# Patient Record
Sex: Male | Born: 1995 | Race: White | Hispanic: No | Marital: Single | Smoking: Never smoker
Health system: Southern US, Community
[De-identification: ages and names within clinical notes are randomized; demographics above are authoritative.]

## PROBLEM LIST (undated history)

## (undated) DIAGNOSIS — S060X9A Concussion with loss of consciousness of unspecified duration, initial encounter: Secondary | ICD-10-CM

## (undated) DIAGNOSIS — S060XAA Concussion with loss of consciousness status unknown, initial encounter: Secondary | ICD-10-CM

---
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
## 2015-04-22 ENCOUNTER — Encounter (HOSPITAL_COMMUNITY): Payer: Self-pay | Admitting: *Deleted

## 2015-04-22 ENCOUNTER — Emergency Department (HOSPITAL_COMMUNITY)
Admission: EM | Admit: 2015-04-22 | Discharge: 2015-04-22 | Disposition: A | Discharge status: Home / Self Care | Payer: BLUE CROSS/BLUE SHIELD | Attending: Emergency Medicine | Admitting: Emergency Medicine

## 2015-04-22 DIAGNOSIS — Y9389 Activity, other specified: Secondary | ICD-10-CM | POA: Diagnosis not present

## 2015-04-22 DIAGNOSIS — Y998 Other external cause status: Secondary | ICD-10-CM | POA: Diagnosis not present

## 2015-04-22 DIAGNOSIS — Y9289 Other specified places as the place of occurrence of the external cause: Secondary | ICD-10-CM | POA: Insufficient documentation

## 2015-04-22 DIAGNOSIS — W228XXA Striking against or struck by other objects, initial encounter: Secondary | ICD-10-CM | POA: Insufficient documentation

## 2015-04-22 DIAGNOSIS — S060X0A Concussion without loss of consciousness, initial encounter: Secondary | ICD-10-CM | POA: Diagnosis not present

## 2015-04-22 DIAGNOSIS — S0990XA Unspecified injury of head, initial encounter: Secondary | ICD-10-CM | POA: Diagnosis present

## 2015-04-22 HISTORY — DX: Concussion with loss of consciousness status unknown, initial encounter: S06.0XAA

## 2015-04-22 HISTORY — DX: Concussion with loss of consciousness of unspecified duration, initial encounter: S06.0X9A

## 2015-04-22 MED ORDER — IBUPROFEN 600 MG PO TABS: 600.0000 mg | ORAL_TABLET | Freq: Four times a day (QID) | ORAL | Status: AC | PRN

## 2015-04-22 MED ORDER — MECLIZINE HCL 25 MG PO TABS: 25.0000 mg | ORAL_TABLET | Freq: Three times a day (TID) | ORAL | Status: AC | PRN

## 2015-04-22 NOTE — ED Notes (Signed)
Pt states that he hit his head on a countertop a week ago. Pt states that he has had headache and dizziness since then. Pt alert and oriented at this time.

## 2015-04-22 NOTE — Discharge Instructions (Signed)
Ibuprofen for pain. Meclizine for dizziness as needed. Rest. Avoid strenuous activity. Follow up as needed. Return if worsening.    Concussion, Adult A concussion, or closed-head injury, is a brain injury caused by a direct blow to the head or by a quick and sudden movement (jolt) of the head or neck. Concussions are usually not life-threatening. Even so, the effects of a concussion can be serious. If you have had a concussion before, you are more likely to experience concussion-like symptoms after a direct blow to the head.  CAUSES  Direct blow to the head, such as from running into another player during a soccer game, being hit in a fight, or hitting your head on a hard surface.  A jolt of the head or neck that causes the brain to move back and forth inside the skull, such as in a car crash. SIGNS AND SYMPTOMS The signs of a concussion can be hard to notice. Early on, they may be missed by you, family members, and health care providers. You may look fine but act or feel differently. Symptoms are usually temporary, but they may last for days, weeks, or even longer. Some symptoms may appear right away while others may not show up for hours or days. Every head injury is different. Symptoms include:  Mild to moderate headaches that will not go away.  A feeling of pressure inside your head.  Having more trouble than usual:  Learning or remembering things you have heard.  Answering questions.  Paying attention or concentrating.  Organizing daily tasks.  Making decisions and solving problems.  Slowness in thinking, acting or reacting, speaking, or reading.  Getting lost or being easily confused.  Feeling tired all the time or lacking energy (fatigued).  Feeling drowsy.  Sleep disturbances.  Sleeping more than usual.  Sleeping less than usual.  Trouble falling asleep.  Trouble sleeping (insomnia).  Loss of balance or feeling lightheaded or dizzy.  Nausea or  vomiting.  Numbness or tingling.  Increased sensitivity to:  Sounds.  Lights.  Distractions.  Vision problems or eyes that tire easily.  Diminished sense of taste or smell.  Ringing in the ears.  Mood changes such as feeling sad or anxious.  Becoming easily irritated or angry for little or no reason.  Lack of motivation.  Seeing or hearing things other people do not see or hear (hallucinations). DIAGNOSIS Your health care provider can usually diagnose a concussion based on a description of your injury and symptoms. He or she will ask whether you passed out (lost consciousness) and whether you are having trouble remembering events that happened right before and during your injury. Your evaluation might include:  A brain scan to look for signs of injury to the brain. Even if the test shows no injury, you may still have a concussion.  Blood tests to be sure other problems are not present. TREATMENT  Concussions are usually treated in an emergency department, in urgent care, or at a clinic. You may need to stay in the hospital overnight for further treatment.  Tell your health care provider if you are taking any medicines, including prescription medicines, over-the-counter medicines, and natural remedies. Some medicines, such as blood thinners (anticoagulants) and aspirin, may increase the chance of complications. Also tell your health care provider whether you have had alcohol or are taking illegal drugs. This information may affect treatment.  Your health care provider will send you home with important instructions to follow.  How fast you will recover  from a concussion depends on many factors. These factors include how severe your concussion is, what part of your brain was injured, your age, and how healthy you were before the concussion.  Most people with mild injuries recover fully. Recovery can take time. In general, recovery is slower in older persons. Also, persons who  have had a concussion in the past or have other medical problems may find that it takes longer to recover from their current injury. HOME CARE INSTRUCTIONS General Instructions  Carefully follow the directions your health care provider gave you.  Only take over-the-counter or prescription medicines for pain, discomfort, or fever as directed by your health care provider.  Take only those medicines that your health care provider has approved.  Do not drink alcohol until your health care provider says you are well enough to do so. Alcohol and certain other drugs may slow your recovery and can put you at risk of further injury.  If it is harder than usual to remember things, write them down.  If you are easily distracted, try to do one thing at a time. For example, do not try to watch TV while fixing dinner.  Talk with family members or close friends when making important decisions.  Keep all follow-up appointments. Repeated evaluation of your symptoms is recommended for your recovery.  Watch your symptoms and tell others to do the same. Complications sometimes occur after a concussion. Older adults with a brain injury may have a higher risk of serious complications, such as a blood clot on the brain.  Tell your teachers, school nurse, school counselor, coach, athletic trainer, or work Production designer, theatre/television/filmmanager about your injury, symptoms, and restrictions. Tell them about what you can or cannot do. They should watch for:  Increased problems with attention or concentration.  Increased difficulty remembering or learning new information.  Increased time needed to complete tasks or assignments.  Increased irritability or decreased ability to cope with stress.  Increased symptoms.  Rest. Rest helps the brain to heal. Make sure you:  Get plenty of sleep at night. Avoid staying up late at night.  Keep the same bedtime hours on weekends and weekdays.  Rest during the day. Take daytime naps or rest breaks  when you feel tired.  Limit activities that require a lot of thought or concentration. These include:  Doing homework or job-related work.  Watching TV.  Working on the computer.  Avoid any situation where there is potential for another head injury (football, hockey, soccer, basketball, martial arts, downhill snow sports and horseback riding). Your condition will get worse every time you experience a concussion. You should avoid these activities until you are evaluated by the appropriate follow-up health care providers. Returning To Your Regular Activities You will need to return to your normal activities slowly, not all at once. You must give your body and brain enough time for recovery.  Do not return to sports or other athletic activities until your health care provider tells you it is safe to do so.  Ask your health care provider when you can drive, ride a bicycle, or operate heavy machinery. Your ability to react may be slower after a brain injury. Never do these activities if you are dizzy.  Ask your health care provider about when you can return to work or school. Preventing Another Concussion It is very important to avoid another brain injury, especially before you have recovered. In rare cases, another injury can lead to permanent brain damage, brain swelling, or  death. The risk of this is greatest during the first 7-10 days after a head injury. Avoid injuries by:  Wearing a seat belt when riding in a car.  Drinking alcohol only in moderation.  Wearing a helmet when biking, skiing, skateboarding, skating, or doing similar activities.  Avoiding activities that could lead to a second concussion, such as contact or recreational sports, until your health care provider says it is okay.  Taking safety measures in your home.  Remove clutter and tripping hazards from floors and stairways.  Use grab bars in bathrooms and handrails by stairs.  Place non-slip mats on floors and in  bathtubs.  Improve lighting in dim areas. SEEK MEDICAL CARE IF:  You have increased problems paying attention or concentrating.  You have increased difficulty remembering or learning new information.  You need more time to complete tasks or assignments than before.  You have increased irritability or decreased ability to cope with stress.  You have more symptoms than before. Seek medical care if you have any of the following symptoms for more than 2 weeks after your injury:  Lasting (chronic) headaches.  Dizziness or balance problems.  Nausea.  Vision problems.  Increased sensitivity to noise or light.  Depression or mood swings.  Anxiety or irritability.  Memory problems.  Difficulty concentrating or paying attention.  Sleep problems.  Feeling tired all the time. SEEK IMMEDIATE MEDICAL CARE IF:  You have severe or worsening headaches. These may be a sign of a blood clot in the brain.  You have weakness (even if only in one hand, leg, or part of the face).  You have numbness.  You have decreased coordination.  You vomit repeatedly.  You have increased sleepiness.  One pupil is larger than the other.  You have convulsions.  You have slurred speech.  You have increased confusion. This may be a sign of a blood clot in the brain.  You have increased restlessness, agitation, or irritability.  You are unable to recognize people or places.  You have neck pain.  It is difficult to wake you up.  You have unusual behavior changes.  You lose consciousness. MAKE SURE YOU:  Understand these instructions.  Will watch your condition.  Will get help right away if you are not doing well or get worse.   This information is not intended to replace advice given to you by your health care provider. Make sure you discuss any questions you have with your health care provider.   Document Released: 08/27/2003 Document Revised: 06/27/2014 Document Reviewed:  12/27/2012 Elsevier Interactive Patient Education [employer].

## 2015-04-22 NOTE — ED Provider Notes (Signed)
CSN: [id number]     Arrival date & time 04/22/15  1128 History   First MD Initiated Contact with Patient 04/22/15 1154     Chief Complaint  Patient presents with  . Head Injury     (Consider location/radiation/quality/duration/timing/severity/associated sxs/prior Treatment) HPI Riley Frey is a 19 y.o. male medical problems, history of concussion, presents to emergency department complaining of a head injury with intermittent headaches. Patient states that he was at a concert, states hit back of his head on a countertop. He denies loss of consciousness. He states he had some pain at that time. States next morning he developed a headache and ringing in his ears. Patient states that he thought maybe it was related to the loud music. Since then he has had intermittent headache with dizziness, lightheadedness, nausea. He denies any changes in vision. He denies any numbness or weakness in extremities. He denies any neck pain. He denies any vomiting. He also states he has had some nasal congestion and fullness in ears which he attributes to allergies. He states he is able to ambulate normally. He did get dizzy when he tried to skateboard a few days ago. He is not on any blood thinners. No other complaints. Did not take any medications prior to coming in  Past Medical History  Diagnosis Date  . Concussion    History reviewed. No pertinent past surgical history. No family history on file. Social History  Substance Use Topics  . Smoking status: Never Smoker   . Smokeless tobacco: None  . Alcohol Use: None    Review of Systems  Constitutional: Negative for fever and chills.  Eyes: Negative for visual disturbance.  Respiratory: Negative for cough, chest tightness and shortness of breath.   Cardiovascular: Negative for chest pain, palpitations and leg swelling.  Gastrointestinal: Positive for nausea. Negative for vomiting.  Genitourinary: Negative for dysuria, urgency, frequency and  hematuria.  Musculoskeletal: Negative for myalgias, arthralgias, neck pain and neck stiffness.  Skin: Negative for rash.  Allergic/Immunologic: Negative for immunocompromised state.  Neurological: Positive for dizziness, light-headedness and headaches. Negative for weakness and numbness.  All other systems reviewed and are negative.     Allergies  Review of patient's allergies indicates no known allergies.  Home Medications   Prior to Admission medications   Not on File   BP 107/58 mmHg  Pulse 77  Temp(Src) 98.4 F (36.9 C) (Oral)  Resp 16  SpO2 97% Physical Exam  Constitutional: He is oriented to person, place, and time. He appears well-developed and well-nourished. No distress.  HENT:  Head: Normocephalic and atraumatic.  Eyes: Conjunctivae and EOM are normal. Pupils are equal, round, and reactive to light.  Neck: Normal range of motion. Neck supple.  No midline tenderness  Cardiovascular: Normal rate, regular rhythm and normal heart sounds.   Pulmonary/Chest: Effort normal. No respiratory distress. He has no wheezes. He has no rales.  Abdominal: Soft. Bowel sounds are normal. He exhibits no distension. There is no tenderness. There is no rebound.  Musculoskeletal: He exhibits no edema.  Neurological: He is alert and oriented to person, place, and time. He has normal reflexes.  5/5 and equal upper and lower extremity strength bilaterally. Equal grip strength bilaterally. Normal finger to nose and heel to shin. No pronator drift. Patellar reflexes 2+, gait is normal   Skin: Skin is warm and dry.  Nursing note and vitals reviewed.   ED Course  Procedures (including critical care time) Labs Review Labs Reviewed - No  data to display  Imaging Review No results found. I have personally reviewed and evaluated these images and lab results as part of my medical decision-making.   EKG Interpretation None      MDM   Final diagnoses:  Concussion, without loss of  consciousness, initial encounter     patient with a head injury 8 days ago, since then some dizziness, intermittent headache that comes and goes. He denies any headache at present. His neurological exam is normal. He does admit to some allergy symptoms with some ataxia, question mild vertigo. Most likely concussion. Given normal neurological exam, no present symptoms, injury days ago, doubt any major head trauma. Home with ibuprofen, Tylenol, meclizine. Follow-up as needed. I do not think patient needs any imaging on emergent basis at this time.  Filed Vitals:   04/22/15 1147  BP: 107/58  Pulse: 77  Temp: 98.4 F (36.9 C)  TempSrc: Oral  Resp: 16  SpO2: 97%     Jaynie Crumbleatyana Kuzey Ogata, PA-C 04/22/15 1809  Linwood DibblesJon Knapp, MD 04/24/15 1229

## 2018-12-20 ENCOUNTER — Ambulatory Visit: Payer: BC Managed Care – PPO | Admitting: Physician Assistant

## 2018-12-20 ENCOUNTER — Encounter
